# Patient Record
Sex: Male | Born: 2000 | Race: White | Hispanic: No | Marital: Single | Smoking: Never smoker
Health system: Southern US, Community
[De-identification: ages and names within clinical notes are randomized; demographics above are authoritative.]

## PROBLEM LIST (undated history)

## (undated) DIAGNOSIS — T7840XA Allergy, unspecified, initial encounter: Secondary | ICD-10-CM

## (undated) HISTORY — DX: Allergy, unspecified, initial encounter: T78.40XA

## (undated) HISTORY — PX: NO PAST SURGERIES: SHX2092

## (undated) HISTORY — PX: SKIN GRAFT: SHX250

---
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
## 2020-05-27 IMAGING — CT CT HEAD W/O CM
3 series · 15 of 47 positions shown, 18 images · non-contrast
Comparison: None.

CLINICAL DATA: Head trauma, headache.  Blurred vision.

EXAM:
CT HEAD WITHOUT CONTRAST
TECHNIQUE: Contiguous axial images were obtained from the base of the skull
through the vertex without intravenous contrast.

[Series 2: head wo · axial · 0.47mm/px · z∈[-164,-39]mm · 9 of 31 slices shown, 12 images]
[im 3/31  brain]
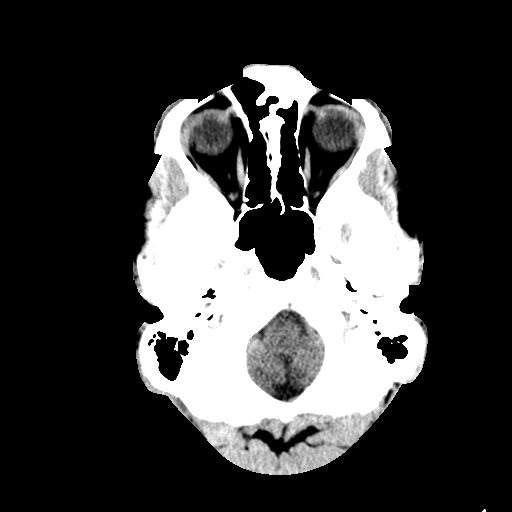
[im 3/31  bone]
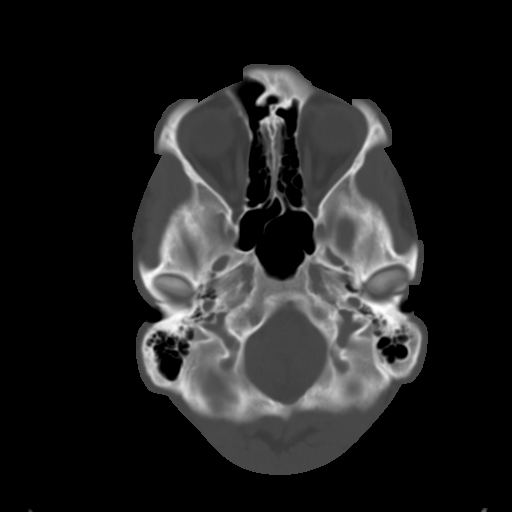
[im 6/31  brain]
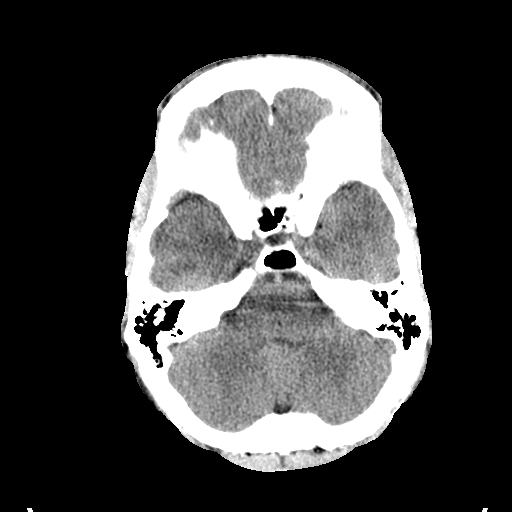
[im 9/31  brain]
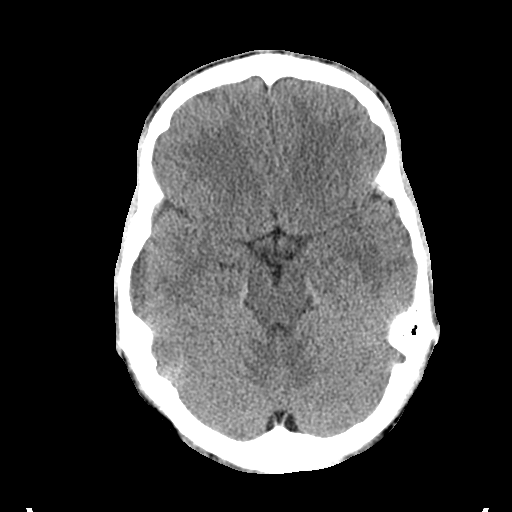
[im 12/31  brain]
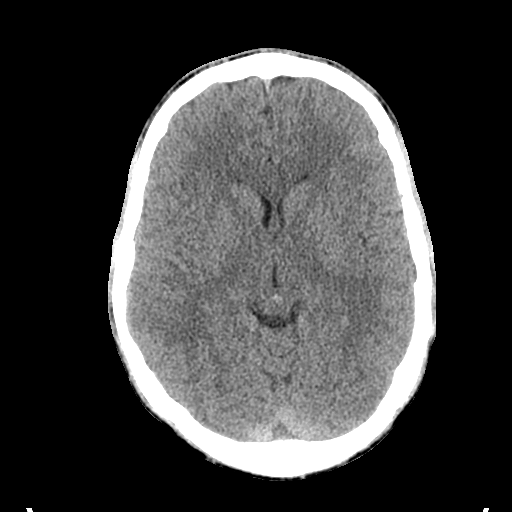
[im 16/31  brain]
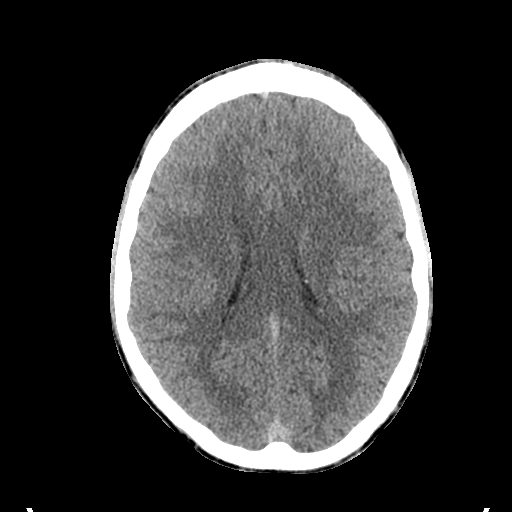
[im 16/31  bone]
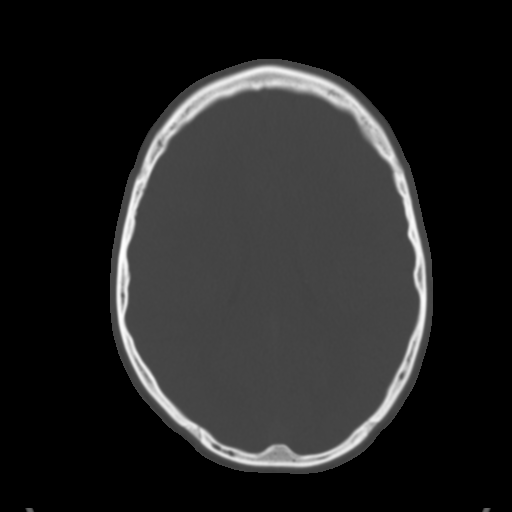
[im 19/31  brain]
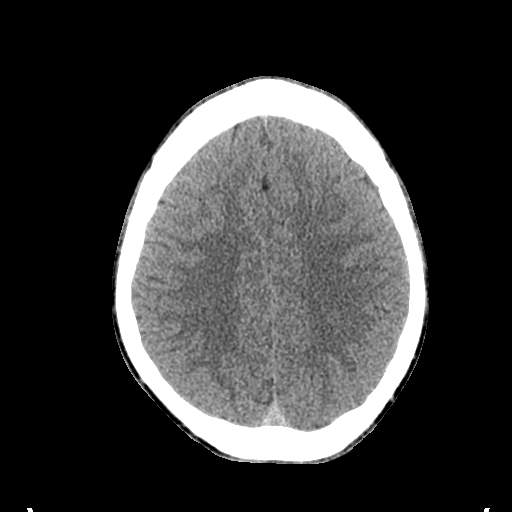
[im 22/31  brain]
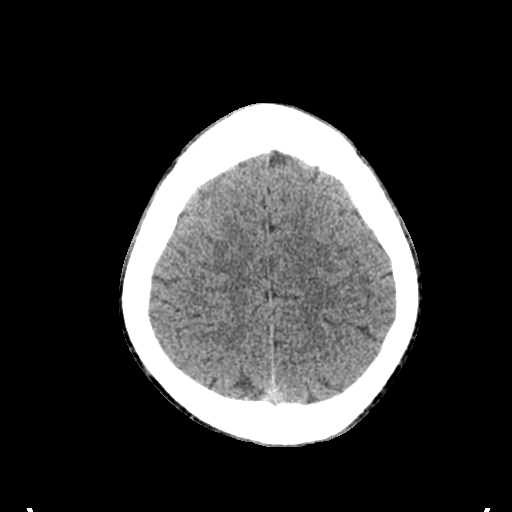
[im 25/31  brain]
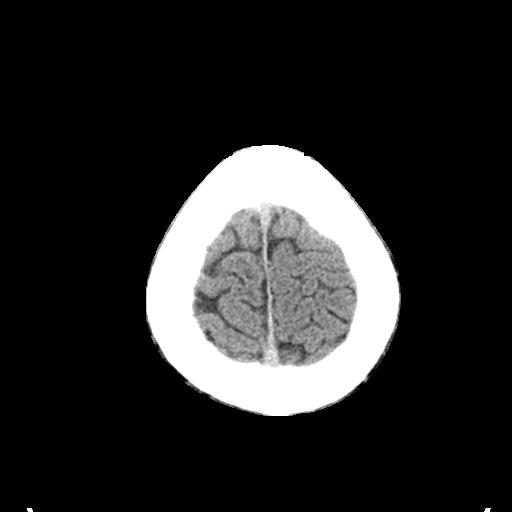
[im 28/31  brain]
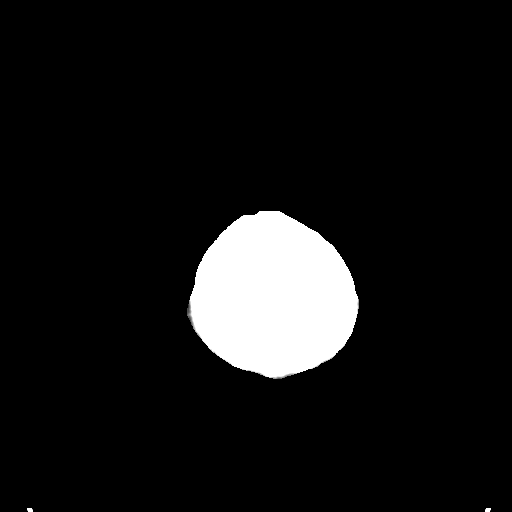
[im 28/31  bone]
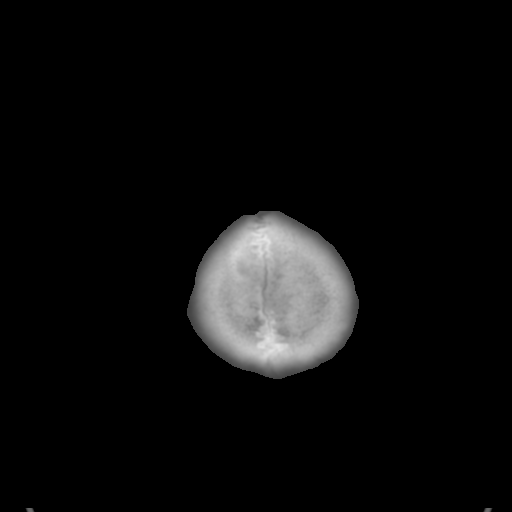

[Series 4: coronal soft tissue · coronal · 0.30mm/px · 3 of 71 slices shown]
[im 24/71  brain]
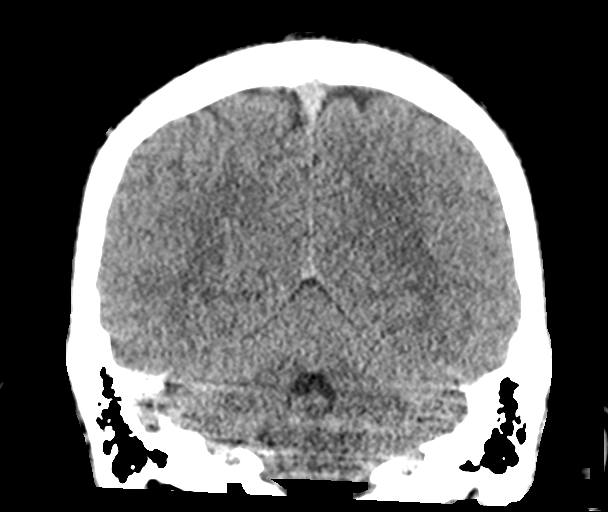
[im 32/71  brain]
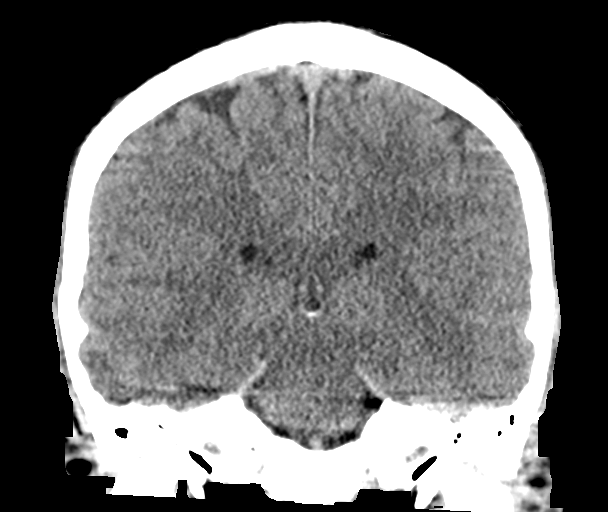
[im 39/71  brain]
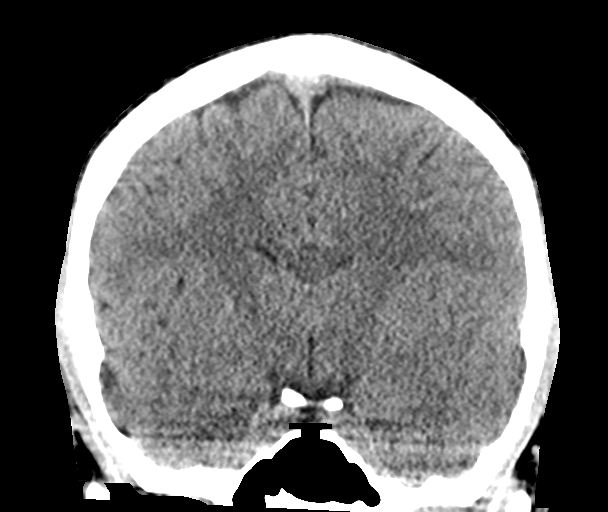

[Series 5: sagittal soft tissue · sagittal · 0.30mm/px · 3 of 62 slices shown]
[im 21/62  brain]
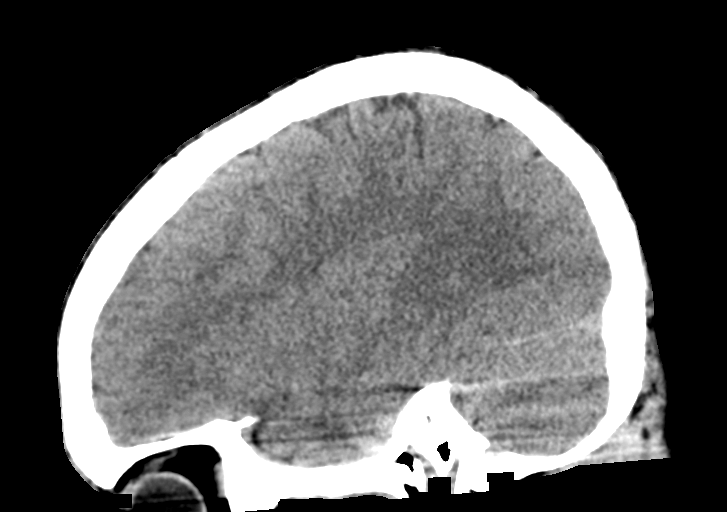
[im 31/62  brain]
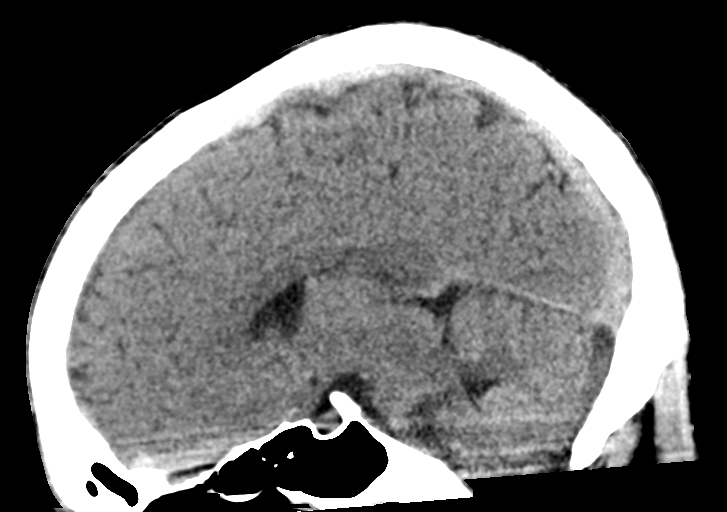
[im 41/62  brain]
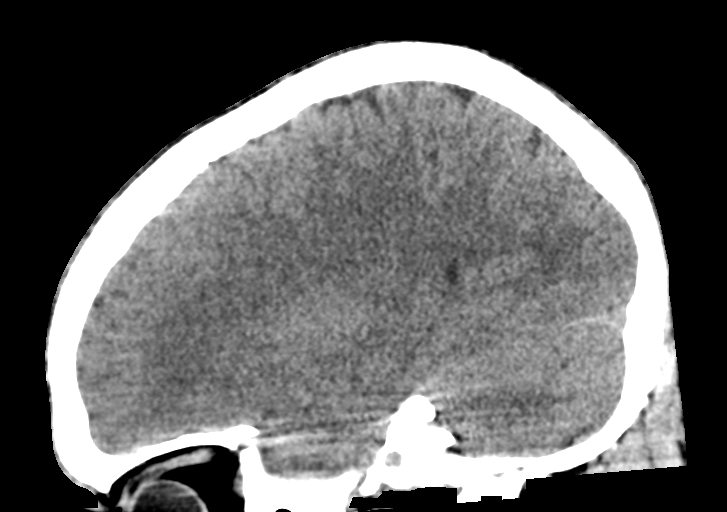

[15 of 47 positions shown; findings below may reference images not displayed]

FINDINGS: Brain: No acute intracranial abnormality. Specifically, no
hemorrhage, hydrocephalus, mass lesion, acute infarction, or
significant intracranial injury.

Vascular: No hyperdense vessel or unexpected calcification.

Skull: No acute calvarial abnormality.

Sinuses/Orbits: Visualized paranasal sinuses and mastoids clear.
Orbital soft tissues unremarkable.

Other: None
IMPRESSION: Normal study.

## 2021-01-10 ENCOUNTER — Other Ambulatory Visit: Payer: Self-pay | Admitting: Physician Assistant

## 2021-01-10 DIAGNOSIS — M25511 Pain in right shoulder: Secondary | ICD-10-CM

## 2021-03-20 ENCOUNTER — Emergency Department
Admission: EM | Admit: 2021-03-20 | Discharge: 2021-03-20 | Disposition: A | Discharge status: Home / Self Care | Payer: Medicaid Other | Attending: Emergency Medicine | Admitting: Emergency Medicine

## 2021-03-20 ENCOUNTER — Encounter: Payer: Self-pay | Admitting: Emergency Medicine

## 2021-03-20 ENCOUNTER — Other Ambulatory Visit: Payer: Self-pay

## 2021-03-20 DIAGNOSIS — Z5321 Procedure and treatment not carried out due to patient leaving prior to being seen by health care provider: Secondary | ICD-10-CM | POA: Diagnosis not present

## 2021-03-20 DIAGNOSIS — R509 Fever, unspecified: Secondary | ICD-10-CM | POA: Diagnosis not present

## 2021-03-20 DIAGNOSIS — J029 Acute pharyngitis, unspecified: Secondary | ICD-10-CM | POA: Insufficient documentation

## 2021-03-20 NOTE — ED Triage Notes (Signed)
Pt c/o sore throat, fever x 2 days, states took Tylenol approx 45 mins PTA.

## 2022-03-22 ENCOUNTER — Ambulatory Visit: Payer: BC Managed Care – PPO | Admitting: Nurse Practitioner

## 2022-04-06 DIAGNOSIS — J069 Acute upper respiratory infection, unspecified: Secondary | ICD-10-CM | POA: Diagnosis not present

## 2022-04-18 ENCOUNTER — Ambulatory Visit (INDEPENDENT_AMBULATORY_CARE_PROVIDER_SITE_OTHER): Payer: BC Managed Care – PPO | Admitting: Nurse Practitioner

## 2022-04-18 ENCOUNTER — Encounter: Payer: Self-pay | Admitting: Nurse Practitioner

## 2022-04-18 ENCOUNTER — Other Ambulatory Visit: Payer: Self-pay

## 2022-04-18 VITALS — BP 114/78 | HR 77 | Temp 97.6°F | Resp 12 | Ht 70.0 in | Wt 195.4 lb

## 2022-04-18 DIAGNOSIS — R197 Diarrhea, unspecified: Secondary | ICD-10-CM | POA: Insufficient documentation

## 2022-04-18 DIAGNOSIS — Z0000 Encounter for general adult medical examination without abnormal findings: Secondary | ICD-10-CM

## 2022-04-18 DIAGNOSIS — R1030 Lower abdominal pain, unspecified: Secondary | ICD-10-CM

## 2022-04-18 LAB — COMPREHENSIVE METABOLIC PANEL
ALT: 14 U/L (ref 0–53)
AST: 15 U/L (ref 0–37)
Albumin: 4.5 g/dL (ref 3.5–5.2)
Alkaline Phosphatase: 52 U/L (ref 39–117)
BUN: 14 mg/dL (ref 6–23)
CO2: 31 mEq/L (ref 19–32)
Calcium: 9.7 mg/dL (ref 8.4–10.5)
Chloride: 104 mEq/L (ref 96–112)
Creatinine, Ser: 1.37 mg/dL (ref 0.40–1.50)
GFR: 74.1 mL/min (ref >60.00)
Glucose, Bld: 76 mg/dL (ref 70–99)
Potassium: 4.5 mEq/L (ref 3.5–5.1)
Sodium: 141 mEq/L (ref 135–145)
Total Bilirubin: 0.8 mg/dL (ref 0.2–1.2)
Total Protein: 6.6 g/dL (ref 6.0–8.3)

## 2022-04-18 LAB — CBC
HCT: 45 % (ref 39.0–52.0)
Hemoglobin: 15.8 g/dL (ref 13.0–17.0)
MCHC: 35.1 g/dL (ref 30.0–36.0)
MCV: 87.2 fl (ref 78.0–100.0)
Platelets: 222 10*3/uL (ref 150.0–400.0)
RBC: 5.15 Mil/uL (ref 4.22–5.81)
RDW: 12.7 % (ref 11.5–14.6)
WBC: 3 10*3/uL — ABNORMAL LOW (ref 4.5–10.5)

## 2022-04-18 LAB — SEDIMENTATION RATE: Sed Rate: 1 mm/hr (ref 0–15)

## 2022-04-18 LAB — TSH: TSH: 0.52 u[IU]/mL (ref 0.35–5.50)

## 2022-04-18 NOTE — Progress Notes (Signed)
New Patient Office Visit  Subjective    Patient ID: Travis Oconnor, male    DOB: Feb 06, 2001  Age: 21 y.o. MRN: [id number]  CC:  Chief Complaint  Patient presents with   Establish Care    Previous PCP in [facility] in [place]- also went to [facility]   Abdominal Pain    Every day x 6 months, lower bilateral abdominal pain. Has a lot of gas, bowel movements are sometimes yellow and liquid like, sometimes green. No nausea or vomiting. Tried several OTC remedies including PPI. More so after eating.    HPI Travis Oconnor presents to establish care  Abdominal pain: States he has been going on for approx 6 months or more. It is described as  States that once a week he will get lower abdominal pains that are intermittent Every day it is a dull pain. States that when he eats after an hour he will get dull pain. States that he will have diarrhea consistently. Orange yellow, gray stool. BMs are 1-2 days. States that after he deficates he feels some better States that he will have less gas after the BM. States that when he eats lots of meats and fats, sugars, greasy foods is worse. Dairy .  Has tried tums, anti gas medication, has not tired a PPI or probiotics. Ususally toughs out the pain  for complete physical and follow up of chronic conditions.  Immunizations: -Tetanus:2014 -Influenza: Out of season -Covid-19: refused. Has had covid -Shingles: Too young -Pneumonia: Too young  -HPV: utd  Diet: Fair diet. Has 3 normal and 2 snacks. Mostly water and sometimes juice or soda. Coffee in the morning. Exercise: 4 times a week. He will run and lift weights.  1 hour  Eye exam: Completes annually wears glasses PRN Dental exam: Needs updating   Colonoscopy: Too young, currently average risk Lung Cancer Screening: N/A Dexa: Too young  PSA: Too young, currently average risk  Sleep: has a weird sleep schedule due to work. He works 24 hour days. On his break will sleep approx 8  hours  Outpatient Encounter Medications as of 04/18/2022  Medication Sig   cetirizine (ZYRTEC) 10 MG tablet Take 10 mg by mouth daily.   diphenhydrAMINE (SOMINEX) 25 MG tablet Take 25 mg by mouth daily. For allergies   [DISCONTINUED] ibuprofen (ADVIL) 200 MG tablet Take by mouth.   No facility-administered encounter medications on file as of 04/18/2022.    Past Medical History:  Diagnosis Date   Allergy     Past Surgical History:  Procedure Laterality Date   NO PAST SURGERIES     SKIN GRAFT Left    left knee and wrist with pig skin    Family History  Problem Relation Age of Onset   Diabetes Father    Diabetes Paternal Uncle    Diabetes Maternal Grandmother    Kidney Stones Maternal Grandfather        recurrent issue   Diabetes Cousin        type 1    Social History   Socioeconomic History   Marital status: Single    Spouse name: Not on file   Number of children: 0   Years of education: Not on file   Highest education level: High school graduate  Occupational History   Not on file  Tobacco Use   Smoking status: Never   Smokeless tobacco: Never  Vaping Use   Vaping Use: Never used  Substance and Sexual Activity  Alcohol use: Never   Drug use: Never   Sexual activity: Not on file  Other Topics Concern   Not on file  Social History Narrative   Working   Fulltime: Charity fundraiser.    Social Determinants of Health   Financial Resource Strain: Not on file  Food Insecurity: Not on file  Transportation Needs: Not on file  Physical Activity: Not on file  Stress: Not on file  Social Connections: Not on file  Intimate Partner Violence: Not on file    Review of Systems  Constitutional:  Negative for chills and fever.  Respiratory:  Negative for shortness of breath.   Cardiovascular:  Negative for chest pain and leg swelling.  Gastrointestinal:  Positive for abdominal pain and diarrhea. Negative for blood in stool, nausea and vomiting.  Genitourinary:   Negative for dysuria and hematuria.  Neurological:  Negative for dizziness, tingling and headaches.  Psychiatric/Behavioral:  Negative for hallucinations and suicidal ideas.         Objective    BP 114/78   Pulse 77   Temp 97.6 F (36.4 C)   Resp 12   Ht 5\' 10"  (1.778 m)   Wt 195 lb 6 oz (88.6 kg)   SpO2 97%   BMI 28.03 kg/m   Physical Exam Vitals and nursing note reviewed. Exam conducted with a chaperone present [facility] [place], RMA).  Constitutional:      Appearance: He is well-developed.  HENT:     Right Ear: Tympanic membrane, ear canal and external ear normal.     Left Ear: Tympanic membrane, ear canal and external ear normal.     Mouth/Throat:     Mouth: Mucous membranes are moist.     Pharynx: Oropharynx is clear.  Eyes:     Extraocular Movements: Extraocular movements intact.     Pupils: Pupils are equal, round, and reactive to light.  Cardiovascular:     Rate and Rhythm: Normal rate and regular rhythm.  Pulmonary:     Effort: Pulmonary effort is normal.     Breath sounds: Normal breath sounds.  Abdominal:     General: Bowel sounds are normal. There is no distension.     Palpations: There is no mass.     Tenderness: There is abdominal tenderness.     Hernia: No hernia is present. There is no hernia in the left inguinal area or right inguinal area.    Genitourinary:    Penis: Normal.      Testes: Normal.     Epididymis:     Right: Normal.     Left: Normal.  Musculoskeletal:     Right lower leg: No edema.     Left lower leg: No edema.  Lymphadenopathy:     Cervical: No cervical adenopathy.     Lower Body: No right inguinal adenopathy. No left inguinal adenopathy.  Skin:    General: Skin is warm.  Neurological:     General: No focal deficit present.     Mental Status: He is alert.     Deep Tendon Reflexes:     Reflex Scores:      Bicep reflexes are 2+ on the right side and 2+ on the left side.      Patellar reflexes are 2+ on the right side  and 2+ on the left side.    Comments: Bilateral upper and lower extremity strength 5/5         Assessment & Plan:   Problem List Items Addressed This  Visit       Digestive   Diarrhea of presumed infectious origin    Patient's dealing with several episodes of diarrhea daily for approximately the past 6 months maybe little longer.  No international travel will obtain stool specimen for PCR and C. difficile to rule out infectious etiology.  Pending lab results Of note patient also states stool different colors see office note      Relevant Orders   CBC   Comprehensive metabolic panel   Sedimentation rate   C. difficile GDH and Toxin A/B   TSH   Gastrointestinal Pathogen Pnl RT, PCR     Other   Lower abdominal pain - Primary    Ambiguous in nature.  Patient has tried some over-the-counter remedies without great relief.  Did recommend patient to try a probiotic see if this does help.  Pending lab results and stool samples.  Differentials inclusive of infectious origin versus IBS-D      Relevant Orders   CBC   Comprehensive metabolic panel   Sedimentation rate   C. difficile GDH and Toxin A/B   TSH   Preventative health care    Discussed age-appropriate immunizations and screening exams.      Relevant Orders   CBC   Comprehensive metabolic panel   TSH    Return in about 1 year (around 04/19/2023) for cpe and labs.   Audria Nine, NP

## 2022-04-18 NOTE — Patient Instructions (Signed)
Nice to see you today I will be in touch with the labs once I have them I would try an over the counter probiotic and see if it helps with the symptoms I want to see you in a year for your physical, sooner if you need me

## 2022-04-18 NOTE — Assessment & Plan Note (Signed)
Ambiguous in nature.  Patient has tried some over-the-counter remedies without great relief.  Did recommend patient to try a probiotic see if this does help.  Pending lab results and stool samples.  Differentials inclusive of infectious origin versus IBS-D

## 2022-04-18 NOTE — Assessment & Plan Note (Signed)
Patient's dealing with several episodes of diarrhea daily for approximately the past 6 months maybe little longer.  No international travel will obtain stool specimen for PCR and C. difficile to rule out infectious etiology.  Pending lab results Of note patient also states stool different colors see office note

## 2022-04-18 NOTE — Assessment & Plan Note (Signed)
Discussed age-appropriate immunizations and screening exams. 

## 2022-04-19 LAB — GASTROINTESTINAL PATHOGEN PNL
CampyloBacter Group: NOT DETECTED
Norovirus GI/GII: NOT DETECTED
Rotavirus A: NOT DETECTED
Salmonella species: NOT DETECTED
Shiga Toxin 1: NOT DETECTED
Shiga Toxin 2: NOT DETECTED
Shigella Species: NOT DETECTED
Vibrio Group: NOT DETECTED
Yersinia enterocolitica: NOT DETECTED

## 2022-04-19 LAB — C. DIFFICILE GDH AND TOXIN A/B
GDH ANTIGEN: NOT DETECTED
MICRO NUMBER:: 13750578
SPECIMEN QUALITY:: ADEQUATE
TOXIN A AND B: NOT DETECTED

## 2022-05-22 ENCOUNTER — Other Ambulatory Visit: Payer: Self-pay | Admitting: Family Medicine

## 2022-05-22 ENCOUNTER — Ambulatory Visit (INDEPENDENT_AMBULATORY_CARE_PROVIDER_SITE_OTHER): Payer: BC Managed Care – PPO | Admitting: Family Medicine

## 2022-05-22 VITALS — BP 90/60 | HR 76 | Temp 98.0°F | Wt 197.0 lb

## 2022-05-22 DIAGNOSIS — D72819 Decreased white blood cell count, unspecified: Secondary | ICD-10-CM | POA: Diagnosis not present

## 2022-05-22 DIAGNOSIS — J029 Acute pharyngitis, unspecified: Secondary | ICD-10-CM

## 2022-05-22 DIAGNOSIS — K529 Noninfective gastroenteritis and colitis, unspecified: Secondary | ICD-10-CM | POA: Diagnosis not present

## 2022-05-22 DIAGNOSIS — R5382 Chronic fatigue, unspecified: Secondary | ICD-10-CM | POA: Diagnosis not present

## 2022-05-22 LAB — CBC WITH DIFFERENTIAL/PLATELET
Basophils Absolute: 0 10*3/uL (ref 0.0–0.1)
Basophils Relative: 1.1 % (ref 0.0–3.0)
Eosinophils Absolute: 0.1 10*3/uL (ref 0.0–0.7)
Eosinophils Relative: 3.3 % (ref 0.0–5.0)
HCT: 46.7 % (ref 39.0–52.0)
Hemoglobin: 16.3 g/dL (ref 13.0–17.0)
Lymphocytes Relative: 33.1 % (ref 12.0–46.0)
Lymphs Abs: 1 10*3/uL (ref 0.7–4.0)
MCHC: 34.9 g/dL (ref 30.0–36.0)
MCV: 87.6 fl (ref 78.0–100.0)
Monocytes Absolute: 0.3 10*3/uL (ref 0.1–1.0)
Monocytes Relative: 9.6 % (ref 3.0–12.0)
Neutro Abs: 1.5 10*3/uL (ref 1.4–7.7)
Neutrophils Relative %: 52.9 % (ref 43.0–77.0)
Platelets: 209 10*3/uL (ref 150.0–400.0)
RBC: 5.33 Mil/uL (ref 4.22–5.81)
RDW: 13 % (ref 11.5–14.6)
WBC: 2.9 10*3/uL — ABNORMAL LOW (ref 4.5–10.5)

## 2022-05-22 LAB — VITAMIN D 25 HYDROXY (VIT D DEFICIENCY, FRACTURES): VITD: 33.15 ng/mL (ref 30.00–100.00)

## 2022-05-22 LAB — VITAMIN B12: Vitamin B-12: 432 pg/mL (ref 211–911)

## 2022-05-22 LAB — FERRITIN: Ferritin: 72.4 ng/mL (ref 22.0–322.0)

## 2022-05-22 NOTE — Assessment & Plan Note (Signed)
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  Discussed this is likely the reason he is feeling tired, however, with loose bowels will check for poor absorption and vitamin deficiencies.

## 2022-05-22 NOTE — Assessment & Plan Note (Signed)
Noted on last check, will recheck today and get differential.

## 2022-05-22 NOTE — Assessment & Plan Note (Signed)
Daily diarrhea for greater than 6 months per patient.  Infectious evaluation last month was normal.  Discussed checking celiac as well as stool testing for calprotectin and FOBT.  Handout for low FODMAP diet in the event that work-up is negative.  No improvement with dietary changes advised GI referral.

## 2022-05-22 NOTE — Progress Notes (Signed)
Persistent leukopenia.   Will get smear review add on

## 2022-05-22 NOTE — Progress Notes (Signed)
Subjective:     Travis Oconnor is a 21 y.o. male presenting for Sore Throat (All day on 9/10, cough, fatigue. Negative covid test on 9/10)     Sore Throat  This is a new problem. The current episode started yesterday. The problem has been gradually improving. There has been no fever. Associated symptoms include coughing. Pertinent negatives include no congestion, diarrhea, ear pain, shortness of breath or vomiting.   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 Negative for chills and fever.  HENT:  Positive for sore throat. Negative for congestion, ear pain, postnasal drip, rhinorrhea, sinus pressure and sinus pain.   Respiratory:  Positive for cough. Negative for shortness of breath.   Cardiovascular:  Negative for chest pain.  Gastrointestinal:  Negative for diarrhea, nausea and vomiting.  Musculoskeletal:  Negative for arthralgias and myalgias.     Social History   Tobacco Use  Smoking Status Never  Smokeless Tobacco Never        Objective:    BP Readings from Last 3 Encounters:  05/22/22 90/60  04/18/22 114/78  03/20/21 131/77   Wt Readings from Last 3 Encounters:  05/22/22 197 lb (89.4 kg)  04/18/22 195 lb 6 oz (88.6 kg)  03/20/21 180 lb (81.6 kg) (81 %, Z= 0.87)*   * Growth percentiles are based on CDC (Boys, 2-20 Years) data.    BP 90/60   Pulse 76   Temp 98 F (36.7 C) (Temporal)   Wt 197 lb (89.4 kg)   SpO2 98%   BMI 28.27 kg/m    Physical Exam Constitutional:      General: He is not in acute distress.    Appearance: He is well-developed. He is not ill-appearing.  HENT:     Head: Normocephalic and atraumatic.     Right Ear: Tympanic membrane and ear canal normal.     Left Ear: Tympanic membrane and ear  canal normal.     Nose: Mucosal edema and rhinorrhea present.     Right Sinus: No maxillary sinus tenderness or frontal sinus tenderness.     Left Sinus: No maxillary sinus tenderness or frontal sinus tenderness.     Mouth/Throat:     Pharynx: Uvula midline. Posterior oropharyngeal erythema present. No oropharyngeal exudate.     Tonsils: No tonsillar exudate. 0 on the right. 0 on the left.  Eyes:     Conjunctiva/sclera: Conjunctivae normal.     Pupils: Pupils are equal, round, and reactive to light.  Cardiovascular:     Rate and Rhythm: Normal rate and regular rhythm.     Heart sounds: No murmur heard. Pulmonary:     Effort: Pulmonary effort is normal. No respiratory distress.     Breath sounds: Normal breath sounds.  Musculoskeletal:     Cervical back: Neck supple.  Lymphadenopathy:     Cervical: No cervical adenopathy.  Skin:    General: Skin is warm and dry.     Capillary Refill: Capillary refill takes less than 2 seconds.  Neurological:     Mental Status: He is alert.           Assessment & Plan:   Problem List Items Addressed This Visit  Digestive   Chronic diarrhea - Primary    Daily diarrhea for greater than 6 months per patient.  Infectious evaluation last month was normal.  Discussed checking celiac as well as stool testing for calprotectin and FOBT.  Handout for low FODMAP diet in the event that work-up is negative.  No improvement with dietary changes advised GI referral.      Relevant Orders   Celiac Pnl 2 rflx Endomysial Ab Ttr   Calprotectin, Fecal   Fecal occult blood, imunochemical     Other   Leukopenia    Noted on last check, will recheck today and get differential.      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  Discussed this is likely the reason he is feeling tired, however, with loose bowels will check for poor absorption and vitamin deficiencies.       Relevant Orders   Vitamin D, 25-hydroxy   Ferritin   Vitamin B12   Other Visit Diagnoses     Sore throat          Suspect viral sore throat. No exudates and cough. Offered strep testing but as improving pt will watch and wait. Ibuprofen and otc medication prn.   Return in about 6 weeks (around 07/03/2022).  Lynnda Child, MD

## 2022-05-22 NOTE — Patient Instructions (Addendum)
Based on your symptoms, it looks like you have a virus.   Antibiotics are not need for a viral infection but the following will help:   Drink plenty of fluids Get lots of rest  Sinus Congestion 1) Neti Pot (Saline rinse) -- 2 times day -- if tolerated 2) Flonase (Store Brand ok) - once daily 3) Over the counter congestion medications  Cough 1) Cough drops can be helpful 2) Nyquil (or nighttime cough medication) 3) Honey is proven to be one of the best cough medications  4) Cough medicine with Dextromethorphan can also be helpful  Sore Throat 1) Honey as above, cough drops 2) Ibuprofen or Aleve can be helpful 3) Salt water Gargles  If you develop fevers (Temperature >100.4), chills, worsening symptoms or symptoms lasting longer than 10 days return to clinic.    Low-FODMAP Eating Plan  FODMAP stands for fermentable oligosaccharides, disaccharides, monosaccharides, and polyols. These are sugars that are hard for some people to digest. A low-FODMAP eating plan may help some people who have irritable bowel syndrome (IBS) and certain other bowel (intestinal) diseases to manage their symptoms. This meal plan can be complicated to follow. Work with a diet and nutrition specialist (dietitian) to make a low-FODMAP eating plan that is right for you. A dietitian can help make sure that you get enough nutrition from this diet. What are tips for following this plan? Reading food labels Check labels for hidden FODMAPs such as: High-fructose syrup. Honey. Agave. Natural fruit flavors. Onion or garlic powder. Choose low-FODMAP foods that contain 3-4 grams of fiber per serving. Check food labels for serving sizes. Eat only one serving at a time to make sure FODMAP levels stay low. Shopping Shop with a list of foods that are recommended on this diet and make a meal plan. Meal planning Follow a low-FODMAP eating plan for up to 6 weeks, or as told by your health care provider or dietitian. To  follow the eating plan: Eliminate high-FODMAP foods from your diet completely. Choose only low-FODMAP foods to eat. You will do this for 2-6 weeks. Gradually reintroduce high-FODMAP foods into your diet one at a time. Most people should wait a few days before introducing the next new high-FODMAP food into their meal plan. Your dietitian can recommend how quickly you may reintroduce foods. Keep a daily record of what and how much you eat and drink. Make note of any symptoms that you have after eating. Review your daily record with a dietitian regularly to identify which foods you can eat and which foods you should avoid. General tips Drink enough fluid each day to keep your urine pale yellow. Avoid processed foods. These often have added sugar and may be high in FODMAPs. Avoid most dairy products, whole grains, and sweeteners. Work with a dietitian to make sure you get enough fiber in your diet. Avoid high FODMAP foods at meals to manage symptoms. Recommended foods Fruits Bananas, oranges, tangerines, lemons, limes, blueberries, raspberries, strawberries, grapes, cantaloupe, honeydew melon, kiwi, papaya, passion fruit, and pineapple. Limited amounts of dried cranberries, banana chips, and shredded coconut. Vegetables Eggplant, zucchini, cucumber, peppers, green beans, bean sprouts, lettuce, arugula, kale, Swiss chard, spinach, collard greens, bok choy, summer squash, potato, and tomato. Limited amounts of corn, carrot, and sweet potato. Green parts of scallions. Grains Gluten-free grains, such as rice, oats, buckwheat, quinoa, corn, polenta, and millet. Gluten-free pasta, bread, or cereal. Rice noodles. Corn tortillas. Meats and other proteins Unseasoned beef, pork, poultry, or fish. Eggs. Tomasa Blase.  Tofu (firm) and tempeh. Limited amounts of nuts and seeds, such as almonds, walnuts, [place] nuts, pecans, peanuts, nut butters, pumpkin seeds, chia seeds, and sunflower seeds. Dairy Lactose-free milk,  yogurt, and kefir. Lactose-free cottage cheese and ice cream. Non-dairy milks, such as almond, coconut, hemp, and rice milk. Non-dairy yogurt. Limited amounts of goat cheese, brie, mozzarella, parmesan, swiss, and other hard cheeses. Fats and oils Butter-free spreads. Vegetable oils, such as olive, canola, and sunflower oil. Seasoning and other foods Artificial sweeteners with names that do not end in "ol," such as aspartame, saccharine, and stevia. Maple syrup, white table sugar, raw sugar, brown sugar, and molasses. Mayonnaise, soy sauce, and tamari. Fresh basil, coriander, parsley, rosemary, and thyme. Beverages Water and mineral water. Sugar-sweetened soft drinks. Small amounts of orange juice or cranberry juice. Black and green tea. Most dry wines. Coffee. The items listed above may not be a complete list of foods and beverages you can eat. Contact a dietitian for more information. Foods to avoid Fruits Fresh, dried, and juiced forms of apple, pear, watermelon, peach, plum, cherries, apricots, blackberries, boysenberries, figs, nectarines, and mango. Avocado. Vegetables Chicory root, artichoke, asparagus, cabbage, snow peas, Brussels sprouts, broccoli, sugar snap peas, mushrooms, celery, and cauliflower. Onions, garlic, leeks, and the white part of scallions. Grains Wheat, including kamut, durum, and semolina. Barley and bulgur. Couscous. Wheat-based cereals. Wheat noodles, bread, crackers, and pastries. Meats and other proteins Fried or fatty meat. Sausage. Cashews and pistachios. Soybeans, baked beans, black beans, chickpeas, kidney beans, fava beans, navy beans, lentils, black-eyed peas, and split peas. Dairy Milk, yogurt, ice cream, and soft cheese. Cream and sour cream. Milk-based sauces. Custard. Buttermilk. Soy milk. Seasoning and other foods Any sugar-free gum or candy. Foods that contain artificial sweeteners such as sorbitol, mannitol, isomalt, or xylitol. Foods that contain honey,  high-fructose corn syrup, or agave. Bouillon, vegetable stock, beef stock, and chicken stock. Garlic and onion powder. Condiments made with onion, such as hummus, chutney, pickles, relish, salad dressing, and salsa. Tomato paste. Beverages Chicory-based drinks. Coffee substitutes. Chamomile tea. Fennel tea. Sweet or fortified wines such as port or sherry. Diet soft drinks made with isomalt, mannitol, maltitol, sorbitol, or xylitol. Apple, pear, and mango juice. Juices with high-fructose corn syrup. The items listed above may not be a complete list of foods and beverages you should avoid. Contact a dietitian for more information. Summary FODMAP stands for fermentable oligosaccharides, disaccharides, monosaccharides, and polyols. These are sugars that are hard for some people to digest. A low-FODMAP eating plan is a short-term diet that helps to ease symptoms of certain bowel diseases. The eating plan usually lasts up to 6 weeks. After that, high-FODMAP foods are reintroduced gradually and one at a time. This can help you find out which foods may be causing symptoms. A low-FODMAP eating plan can be complicated. It is best to work with a dietitian who has experience with this type of plan. This information is not intended to replace advice given to you by your health care provider. Make sure you discuss any questions you have with your health care provider. Document Revised: 01/15/2020 Document Reviewed: 01/15/2020 Elsevier Patient Education  2023 [employer].

## 2022-05-23 ENCOUNTER — Other Ambulatory Visit: Payer: BC Managed Care – PPO

## 2022-05-23 DIAGNOSIS — D72819 Decreased white blood cell count, unspecified: Secondary | ICD-10-CM | POA: Diagnosis not present

## 2022-05-23 LAB — FECAL OCCULT BLOOD, IMMUNOCHEMICAL: Fecal Occult Bld: NEGATIVE

## 2022-05-24 ENCOUNTER — Ambulatory Visit: Payer: BC Managed Care – PPO | Admitting: Nurse Practitioner

## 2022-05-24 ENCOUNTER — Encounter: Payer: Self-pay | Admitting: Radiology

## 2022-05-24 LAB — PATHOLOGIST SMEAR REVIEW

## 2022-05-31 LAB — CELIAC PNL 2 RFLX ENDOMYSIAL AB TTR
(tTG) Ab, IgA: <1 U/mL
(tTG) Ab, IgG: <1 U/mL
Endomysial Ab IgA: NEGATIVE
Gliadin IgA: <1 U/mL
Gliadin IgG: <1 U/mL
Immunoglobulin A: 108 mg/dL (ref 47–310)

## 2022-06-07 ENCOUNTER — Ambulatory Visit (INDEPENDENT_AMBULATORY_CARE_PROVIDER_SITE_OTHER): Payer: BC Managed Care – PPO | Admitting: Family Medicine

## 2022-06-07 VITALS — BP 100/60 | HR 80 | Temp 97.9°F | Wt 195.5 lb

## 2022-06-07 DIAGNOSIS — J302 Other seasonal allergic rhinitis: Secondary | ICD-10-CM | POA: Insufficient documentation

## 2022-06-07 DIAGNOSIS — J0140 Acute pansinusitis, unspecified: Secondary | ICD-10-CM

## 2022-06-07 DIAGNOSIS — L089 Local infection of the skin and subcutaneous tissue, unspecified: Secondary | ICD-10-CM | POA: Diagnosis not present

## 2022-06-07 DIAGNOSIS — K529 Noninfective gastroenteritis and colitis, unspecified: Secondary | ICD-10-CM

## 2022-06-07 MED ORDER — FEXOFENADINE HCL 180 MG PO TABS: 180.0000 mg | ORAL_TABLET | Freq: Every day | ORAL | 0 refills | Status: AC

## 2022-06-07 MED ORDER — MUPIROCIN 2 % EX OINT: 1.0000 | TOPICAL_OINTMENT | Freq: Two times a day (BID) | CUTANEOUS | 0 refills | Status: AC

## 2022-06-07 MED ORDER — AMOXICILLIN-POT CLAVULANATE 875-125 MG PO TABS: 1.0000 | ORAL_TABLET | Freq: Two times a day (BID) | ORAL | 0 refills | Status: AC

## 2022-06-07 MED ORDER — TRIAMCINOLONE ACETONIDE 55 MCG/ACT NA AERO: 2.0000 | INHALATION_SPRAY | Freq: Every day | NASAL | 12 refills | Status: AC

## 2022-06-07 NOTE — Progress Notes (Signed)
Subjective:     Travis Oconnor is a 21 y.o. male presenting for Sore Throat (Every night x 2 weeks, cough, nasal congestion. States that he feels better during the day. ) and Rash (Small circular rash on R forearm. Pt states it was bigger. )     HPI  #Sore throat - has been taking zyrtec - 2 weeks of symptoms - in the evening  - worse than his normal - cough and congestion - improved during - cough is productive in the morning - allergy medication - evening dose - zyrtec and benadryl - no runny nose - congestion and dry cough - tried flonase a few years ago - no help - some dental pain   #rash - started bigger - got larger  - using cleaning and neosporin and that seems to have helped - stable for 6 days - itchy - but not scratching - hx of ringworm - but this is not like that - wondering about staph infection - was an open wound at one point   Review of Systems   Social History   Tobacco Use  Smoking Status Never  Smokeless Tobacco Never        Objective:    BP Readings from Last 3 Encounters:  06/07/22 100/60  05/22/22 90/60  04/18/22 114/78   Wt Readings from Last 3 Encounters:  06/07/22 195 lb 8 oz (88.7 kg)  05/22/22 197 lb (89.4 kg)  04/18/22 195 lb 6 oz (88.6 kg)    BP 100/60   Pulse 80   Temp 97.9 F (36.6 C) (Temporal)   Wt 195 lb 8 oz (88.7 kg)   SpO2 97%   BMI 28.05 kg/m    Physical Exam Constitutional:      General: He is not in acute distress.    Appearance: He is well-developed. He is not ill-appearing.  HENT:     Head: Normocephalic and atraumatic.     Right Ear: Tympanic membrane and ear canal normal.     Left Ear: Tympanic membrane and ear canal normal.     Nose: Mucosal edema and rhinorrhea present.     Right Sinus: No maxillary sinus tenderness or frontal sinus tenderness.     Left Sinus: No maxillary sinus tenderness or frontal sinus tenderness.     Mouth/Throat:     Pharynx: Uvula midline. Posterior  oropharyngeal erythema present. No oropharyngeal exudate.     Tonsils: 0 on the right. 0 on the left.  Cardiovascular:     Rate and Rhythm: Normal rate and regular rhythm.     Heart sounds: No murmur heard. Pulmonary:     Effort: Pulmonary effort is normal. No respiratory distress.     Breath sounds: Normal breath sounds.  Musculoskeletal:     Cervical back: Neck supple.  Lymphadenopathy:     Cervical: No cervical adenopathy.  Skin:    General: Skin is warm and dry.     Capillary Refill: Capillary refill takes less than 2 seconds.     Comments: Small circular erythematous patch with central scabbing on the right forearm  Neurological:     Mental Status: He is alert.           Assessment & Plan:   Problem List Items Addressed This Visit       Respiratory   Seasonal allergic rhinitis - Primary    Trial of switching to allegra and starting nasacort. If no improvement consider treatment for possible sinus infection.  Relevant Medications   fexofenadine (ALLEGRA) 180 MG tablet   triamcinolone (NASACORT) 55 MCG/ACT AERO nasal inhaler     Digestive   Chronic diarrhea    Improving with low FODMAP diet. Will hold off on getting fecal calprotectin testing. Other work-up negative      Other Visit Diagnoses     Infected skin lesion       Relevant Medications   mupirocin ointment (BACTROBAN) 2 %   Acute non-recurrent pansinusitis       Relevant Medications   fexofenadine (ALLEGRA) 180 MG tablet   triamcinolone (NASACORT) 55 MCG/ACT AERO nasal inhaler   amoxicillin-clavulanate (AUGMENTIN) 875-125 MG tablet        Return if symptoms worsen or fail to improve.  Lesleigh Noe, MD

## 2022-06-07 NOTE — Patient Instructions (Addendum)
Allergies - start Nasacort - start allegra - stop zyrtec - Antibiotics if no improvement in 3-5 days  Rash - use topical ointment x 1 week    Ask about the Fecal Cal if you symptoms return while on the diet

## 2022-06-07 NOTE — Assessment & Plan Note (Signed)
Improving with low FODMAP diet. Will hold off on getting fecal calprotectin testing. Other work-up negative

## 2022-06-07 NOTE — Assessment & Plan Note (Signed)
Trial of switching to allegra and starting nasacort. If no improvement consider treatment for possible sinus infection.

## 2022-06-15 ENCOUNTER — Telehealth: Payer: Self-pay | Admitting: Radiology

## 2022-06-15 NOTE — Telephone Encounter (Signed)
Patient never came back in to pick up supplies for the stool test that was missed, do you still want this?

## 2022-06-15 NOTE — Telephone Encounter (Signed)
Symptoms improving on low Fodmap diet.   Will defer for now

## 2022-07-04 ENCOUNTER — Other Ambulatory Visit: Payer: Self-pay | Admitting: Nurse Practitioner

## 2022-07-04 DIAGNOSIS — D72819 Decreased white blood cell count, unspecified: Secondary | ICD-10-CM

## 2022-07-13 ENCOUNTER — Other Ambulatory Visit: Payer: BC Managed Care – PPO

## 2022-07-22 ENCOUNTER — Emergency Department: Payer: BC Managed Care – PPO

## 2022-07-22 ENCOUNTER — Encounter: Payer: Self-pay | Admitting: Emergency Medicine

## 2022-07-22 ENCOUNTER — Emergency Department
Admission: EM | Admit: 2022-07-22 | Discharge: 2022-07-22 | Disposition: A | Discharge status: Home / Self Care | Payer: BC Managed Care – PPO | Attending: Emergency Medicine | Admitting: Emergency Medicine

## 2022-07-22 ENCOUNTER — Other Ambulatory Visit: Payer: Self-pay

## 2022-07-22 DIAGNOSIS — W51XXXA Accidental striking against or bumped into by another person, initial encounter: Secondary | ICD-10-CM | POA: Diagnosis not present

## 2022-07-22 DIAGNOSIS — Y9239 Other specified sports and athletic area as the place of occurrence of the external cause: Secondary | ICD-10-CM | POA: Insufficient documentation

## 2022-07-22 DIAGNOSIS — S1093XA Contusion of unspecified part of neck, initial encounter: Secondary | ICD-10-CM | POA: Diagnosis not present

## 2022-07-22 DIAGNOSIS — J384 Edema of larynx: Secondary | ICD-10-CM | POA: Diagnosis not present

## 2022-07-22 DIAGNOSIS — S1083XA Contusion of other specified part of neck, initial encounter: Secondary | ICD-10-CM | POA: Diagnosis not present

## 2022-07-22 DIAGNOSIS — S199XXA Unspecified injury of neck, initial encounter: Secondary | ICD-10-CM | POA: Diagnosis not present

## 2022-07-22 DIAGNOSIS — R1310 Dysphagia, unspecified: Secondary | ICD-10-CM | POA: Diagnosis not present

## 2022-07-22 LAB — CBC WITH DIFFERENTIAL/PLATELET
Abs Immature Granulocytes: 0.01 10*3/uL (ref 0.00–0.07)
Basophils Absolute: 0 10*3/uL (ref 0.0–0.1)
Basophils Relative: 1 %
Eosinophils Absolute: 0 10*3/uL (ref 0.0–0.5)
Eosinophils Relative: 1 %
HCT: 51 % (ref 39.0–52.0)
Hemoglobin: 17.7 g/dL — ABNORMAL HIGH (ref 13.0–17.0)
Immature Granulocytes: 0 %
Lymphocytes Relative: 20 %
Lymphs Abs: 0.9 10*3/uL (ref 0.7–4.0)
MCH: 29.9 pg (ref 26.0–34.0)
MCHC: 34.7 g/dL (ref 30.0–36.0)
MCV: 86.1 fL (ref 80.0–100.0)
Monocytes Absolute: 0.4 10*3/uL (ref 0.1–1.0)
Monocytes Relative: 9 %
Neutro Abs: 3.2 10*3/uL (ref 1.7–7.7)
Neutrophils Relative %: 69 %
Platelets: 234 10*3/uL (ref 150–400)
RBC: 5.92 MIL/uL — ABNORMAL HIGH (ref 4.22–5.81)
RDW: 12.4 % (ref 11.5–15.5)
WBC: 4.6 10*3/uL (ref 4.0–10.5)
nRBC: 0 % (ref 0.0–0.2)

## 2022-07-22 LAB — BASIC METABOLIC PANEL
Anion gap: 6 (ref 5–15)
BUN: 15 mg/dL (ref 6–20)
CO2: 25 mmol/L (ref 22–32)
Calcium: 9.5 mg/dL (ref 8.9–10.3)
Chloride: 107 mmol/L (ref 98–111)
Creatinine, Ser: 1.39 mg/dL — ABNORMAL HIGH (ref 0.61–1.24)
GFR, Estimated: >60 mL/min (ref >60)
Glucose, Bld: 93 mg/dL (ref 70–99)
Potassium: 4.2 mmol/L (ref 3.5–5.1)
Sodium: 138 mmol/L (ref 135–145)

## 2022-07-22 MED ORDER — IOHEXOL 350 MG/ML SOLN
75.0000 mL | Freq: Once | INTRAVENOUS | Status: AC | PRN
  Administered 2022-07-22: 75 mL via INTRAVENOUS
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
## 2022-07-22 NOTE — Discharge Instructions (Signed)
Your CAT scan is normal.  Please return for any new, worsening, or change in symptoms or other concerns.  It was a pleasure caring for you today.
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
## 2023-01-26 DIAGNOSIS — R079 Chest pain, unspecified: Secondary | ICD-10-CM | POA: Diagnosis not present

## 2023-01-26 DIAGNOSIS — M546 Pain in thoracic spine: Secondary | ICD-10-CM | POA: Diagnosis not present

## 2023-01-26 DIAGNOSIS — M545 Low back pain, unspecified: Secondary | ICD-10-CM | POA: Diagnosis not present

## 2023-05-30 ENCOUNTER — Encounter: Payer: Self-pay | Admitting: Nurse Practitioner

## 2023-05-30 ENCOUNTER — Ambulatory Visit: Payer: BC Managed Care – PPO | Admitting: Nurse Practitioner

## 2023-05-30 VITALS — BP 124/80 | HR 75 | Temp 97.6°F | Wt 201.4 lb

## 2023-05-30 DIAGNOSIS — R051 Acute cough: Secondary | ICD-10-CM

## 2023-05-30 DIAGNOSIS — J029 Acute pharyngitis, unspecified: Secondary | ICD-10-CM

## 2023-05-30 LAB — POCT RAPID STREP A (OFFICE): Rapid Strep A Screen: NEGATIVE

## 2023-05-30 LAB — POC COVID19 BINAXNOW: SARS Coronavirus 2 Ag: NEGATIVE

## 2023-05-30 NOTE — Patient Instructions (Addendum)
Nice to see you today Covid and strep tests are negative in office You can use over the counter analgesics (tylenol or ibuprofen), warm salt water gargles, and throat lozenges.  Follow up if you do not improve   It is time for your physical. Schedule within the next few months

## 2023-05-30 NOTE — Assessment & Plan Note (Signed)
Strep and COVID test were negative in office.  Patient to use over-the-counter analgesics as directed, warm salt water gargles, throat lozenges.  Patient could also sip on water throughout the day to keep the throat moist.  Signs and symptoms reviewed when to seek urgent or emergent healthcare

## 2023-05-30 NOTE — Progress Notes (Signed)
Acute Office Visit  Subjective:     Patient ID: Travis Oconnor, male    DOB: 10-13-2000, 22 y.o.   MRN: [id number]  Chief Complaint  Patient presents with   Sore Throat    Started 4 days ago. Hard to swallow. On and off soreness.     Patient is in today for sore throat with a history of alleriges, anxiety and leukopenia    Has started 4 days ago. States no sick contacts. State that he has tired benadryl and otc allergy pill. States it did not help with the sore throat  Covid test 2 days ago that was negative  No covid vaccines  Review of Systems  Constitutional:  Positive for malaise/fatigue. Negative for chills and fever.  HENT:  Positive for sore throat. Negative for ear discharge and ear pain.        States that throat feels dry and when he drinks it feels like he is getting choked   Respiratory:  Positive for cough. Negative for sputum production and shortness of breath.   Gastrointestinal:  Negative for abdominal pain, diarrhea, nausea and vomiting.  Musculoskeletal:  Negative for joint pain and myalgias.  Neurological:  Negative for headaches.        Objective:    BP 124/80   Pulse 75   Temp 97.6 F (36.4 C) (Temporal)   Wt 201 lb 6.4 oz (91.4 kg)   SpO2 95%   BMI 28.90 kg/m    Physical Exam Vitals and nursing note reviewed.  Constitutional:      Appearance: Normal appearance.  HENT:     Right Ear: Tympanic membrane, ear canal and external ear normal.     Left Ear: Tympanic membrane, ear canal and external ear normal.     Mouth/Throat:     Mouth: Mucous membranes are moist.     Pharynx: Posterior oropharyngeal erythema present.  Cardiovascular:     Rate and Rhythm: Normal rate and regular rhythm.     Heart sounds: Normal heart sounds.  Pulmonary:     Effort: Pulmonary effort is normal.     Breath sounds: Normal breath sounds.  Lymphadenopathy:     Cervical: No cervical adenopathy.  Neurological:     Mental Status: He is alert.      Results for orders placed or performed in visit on 05/30/23  Rapid Strep A  Result Value Ref Range   Rapid Strep A Screen Negative Negative  POC COVID-19  Result Value Ref Range   SARS Coronavirus 2 Ag Negative Negative        Assessment & Plan:   Problem List Items Addressed This Visit       Respiratory   Viral pharyngitis - Primary    Strep and COVID test were negative in office.  Patient to use over-the-counter analgesics as directed, warm salt water gargles, throat lozenges.  Patient could also sip on water throughout the day to keep the throat moist.  Signs and symptoms reviewed when to seek urgent or emergent healthcare        Other   Acute cough    COVID test in office.      Relevant Orders   POC COVID-19 (Completed)    No orders of the defined types were placed in this encounter.   Return in about 3 months (around 08/29/2023) for CPE and Labs.  Audria Nine, NP

## 2023-05-30 NOTE — Assessment & Plan Note (Signed)
COVID test in office.

## 2024-01-15 DIAGNOSIS — Z1339 Encounter for screening examination for other mental health and behavioral disorders: Secondary | ICD-10-CM | POA: Diagnosis not present

## 2024-01-15 DIAGNOSIS — F331 Major depressive disorder, recurrent, moderate: Secondary | ICD-10-CM | POA: Diagnosis not present

## 2024-01-15 DIAGNOSIS — F411 Generalized anxiety disorder: Secondary | ICD-10-CM | POA: Diagnosis not present

## 2024-01-21 DIAGNOSIS — F331 Major depressive disorder, recurrent, moderate: Secondary | ICD-10-CM | POA: Diagnosis not present

## 2024-01-21 DIAGNOSIS — R41840 Attention and concentration deficit: Secondary | ICD-10-CM | POA: Diagnosis not present

## 2024-01-21 DIAGNOSIS — F411 Generalized anxiety disorder: Secondary | ICD-10-CM | POA: Diagnosis not present

## 2024-01-28 DIAGNOSIS — F411 Generalized anxiety disorder: Secondary | ICD-10-CM | POA: Diagnosis not present

## 2024-01-28 DIAGNOSIS — F909 Attention-deficit hyperactivity disorder, unspecified type: Secondary | ICD-10-CM | POA: Diagnosis not present

## 2024-01-28 DIAGNOSIS — F331 Major depressive disorder, recurrent, moderate: Secondary | ICD-10-CM | POA: Diagnosis not present

## 2024-02-20 DIAGNOSIS — F331 Major depressive disorder, recurrent, moderate: Secondary | ICD-10-CM | POA: Diagnosis not present

## 2024-02-20 DIAGNOSIS — F411 Generalized anxiety disorder: Secondary | ICD-10-CM | POA: Diagnosis not present

## 2024-02-20 DIAGNOSIS — F909 Attention-deficit hyperactivity disorder, unspecified type: Secondary | ICD-10-CM | POA: Diagnosis not present

## 2024-03-31 DIAGNOSIS — F909 Attention-deficit hyperactivity disorder, unspecified type: Secondary | ICD-10-CM | POA: Diagnosis not present

## 2024-03-31 DIAGNOSIS — F411 Generalized anxiety disorder: Secondary | ICD-10-CM | POA: Diagnosis not present

## 2024-04-29 DIAGNOSIS — F331 Major depressive disorder, recurrent, moderate: Secondary | ICD-10-CM | POA: Diagnosis not present

## 2024-04-29 DIAGNOSIS — F411 Generalized anxiety disorder: Secondary | ICD-10-CM | POA: Diagnosis not present

## 2024-04-29 DIAGNOSIS — F909 Attention-deficit hyperactivity disorder, unspecified type: Secondary | ICD-10-CM | POA: Diagnosis not present

## 2024-05-08 DIAGNOSIS — F331 Major depressive disorder, recurrent, moderate: Secondary | ICD-10-CM | POA: Diagnosis not present

## 2024-05-08 DIAGNOSIS — F411 Generalized anxiety disorder: Secondary | ICD-10-CM | POA: Diagnosis not present

## 2024-05-08 DIAGNOSIS — F909 Attention-deficit hyperactivity disorder, unspecified type: Secondary | ICD-10-CM | POA: Diagnosis not present

## 2024-05-15 DIAGNOSIS — F909 Attention-deficit hyperactivity disorder, unspecified type: Secondary | ICD-10-CM | POA: Diagnosis not present

## 2024-05-15 DIAGNOSIS — F411 Generalized anxiety disorder: Secondary | ICD-10-CM | POA: Diagnosis not present

## 2024-05-29 DIAGNOSIS — F411 Generalized anxiety disorder: Secondary | ICD-10-CM | POA: Diagnosis not present

## 2024-05-29 DIAGNOSIS — F909 Attention-deficit hyperactivity disorder, unspecified type: Secondary | ICD-10-CM | POA: Diagnosis not present

## 2024-05-29 DIAGNOSIS — F331 Major depressive disorder, recurrent, moderate: Secondary | ICD-10-CM | POA: Diagnosis not present

## 2024-06-25 DIAGNOSIS — F411 Generalized anxiety disorder: Secondary | ICD-10-CM | POA: Diagnosis not present

## 2024-06-25 DIAGNOSIS — F909 Attention-deficit hyperactivity disorder, unspecified type: Secondary | ICD-10-CM | POA: Diagnosis not present

## 2024-07-23 DIAGNOSIS — F411 Generalized anxiety disorder: Secondary | ICD-10-CM | POA: Diagnosis not present

## 2024-07-23 DIAGNOSIS — F909 Attention-deficit hyperactivity disorder, unspecified type: Secondary | ICD-10-CM | POA: Diagnosis not present
